# Patient Record
Sex: Female | Born: 2000
Health system: Southern US, Community
[De-identification: ages and names within clinical notes are randomized; demographics above are authoritative.]

## PROBLEM LIST (undated history)

## (undated) DIAGNOSIS — R51 Headache: Secondary | ICD-10-CM

## (undated) DIAGNOSIS — F329 Major depressive disorder, single episode, unspecified: Secondary | ICD-10-CM

## (undated) DIAGNOSIS — F419 Anxiety disorder, unspecified: Secondary | ICD-10-CM

## (undated) DIAGNOSIS — F913 Oppositional defiant disorder: Secondary | ICD-10-CM

## (undated) DIAGNOSIS — F32A Depression, unspecified: Secondary | ICD-10-CM

## (undated) DIAGNOSIS — R4689 Other symptoms and signs involving appearance and behavior: Secondary | ICD-10-CM

## (undated) DIAGNOSIS — IMO0002 Reserved for concepts with insufficient information to code with codable children: Secondary | ICD-10-CM

## (undated) DIAGNOSIS — D649 Anemia, unspecified: Secondary | ICD-10-CM

## (undated) DIAGNOSIS — R519 Headache, unspecified: Secondary | ICD-10-CM

## (undated) DIAGNOSIS — F909 Attention-deficit hyperactivity disorder, unspecified type: Secondary | ICD-10-CM

## (undated) DIAGNOSIS — F319 Bipolar disorder, unspecified: Secondary | ICD-10-CM

## (undated) DIAGNOSIS — T7840XA Allergy, unspecified, initial encounter: Secondary | ICD-10-CM

## (undated) DIAGNOSIS — G4700 Insomnia, unspecified: Secondary | ICD-10-CM

## (undated) HISTORY — DX: Headache, unspecified: R51.9

## (undated) HISTORY — DX: Bipolar disorder, unspecified: F31.9

## (undated) HISTORY — DX: Attention-deficit hyperactivity disorder, unspecified type: F90.9

## (undated) HISTORY — PX: HAND SURGERY: SHX662

## (undated) HISTORY — DX: Depression, unspecified: F32.A

## (undated) HISTORY — DX: Oppositional defiant disorder: F91.3

## (undated) HISTORY — DX: Other symptoms and signs involving appearance and behavior: R46.89

## (undated) HISTORY — DX: Allergy, unspecified, initial encounter: T78.40XA

## (undated) HISTORY — DX: Reserved for concepts with insufficient information to code with codable children: IMO0002

## (undated) HISTORY — DX: Major depressive disorder, single episode, unspecified: F32.9

## (undated) HISTORY — PX: DENTAL SURGERY: SHX609

## (undated) HISTORY — DX: Insomnia, unspecified: G47.00

## (undated) HISTORY — DX: Headache: R51

---
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
## 2018-01-29 ENCOUNTER — Other Ambulatory Visit: Payer: Self-pay

## 2018-01-29 ENCOUNTER — Emergency Department (HOSPITAL_COMMUNITY)
Admission: EM | Admit: 2018-01-29 | Discharge: 2018-01-29 | Disposition: A | Discharge status: Home / Self Care | Attending: Emergency Medicine | Admitting: Emergency Medicine

## 2018-01-29 ENCOUNTER — Encounter (HOSPITAL_COMMUNITY): Payer: Self-pay | Admitting: Emergency Medicine

## 2018-01-29 ENCOUNTER — Emergency Department (HOSPITAL_COMMUNITY)

## 2018-01-29 DIAGNOSIS — R110 Nausea: Secondary | ICD-10-CM | POA: Insufficient documentation

## 2018-01-29 DIAGNOSIS — G44209 Tension-type headache, unspecified, not intractable: Secondary | ICD-10-CM | POA: Insufficient documentation

## 2018-01-29 DIAGNOSIS — Y999 Unspecified external cause status: Secondary | ICD-10-CM | POA: Insufficient documentation

## 2018-01-29 DIAGNOSIS — Y9289 Other specified places as the place of occurrence of the external cause: Secondary | ICD-10-CM | POA: Diagnosis not present

## 2018-01-29 DIAGNOSIS — R42 Dizziness and giddiness: Secondary | ICD-10-CM | POA: Insufficient documentation

## 2018-01-29 DIAGNOSIS — Y9389 Activity, other specified: Secondary | ICD-10-CM | POA: Insufficient documentation

## 2018-01-29 DIAGNOSIS — F17210 Nicotine dependence, cigarettes, uncomplicated: Secondary | ICD-10-CM | POA: Diagnosis not present

## 2018-01-29 DIAGNOSIS — W01198A Fall on same level from slipping, tripping and stumbling with subsequent striking against other object, initial encounter: Secondary | ICD-10-CM | POA: Insufficient documentation

## 2018-01-29 DIAGNOSIS — S0990XA Unspecified injury of head, initial encounter: Secondary | ICD-10-CM | POA: Diagnosis present

## 2018-01-29 DIAGNOSIS — S060X0A Concussion without loss of consciousness, initial encounter: Secondary | ICD-10-CM | POA: Insufficient documentation

## 2018-01-29 MED ORDER — METHOCARBAMOL 500 MG PO TABS: 500.0000 mg | ORAL_TABLET | Freq: Three times a day (TID) | ORAL | 0 refills | Status: DC | PRN

## 2018-01-29 MED ORDER — ONDANSETRON 4 MG PO TBDP
4.0000 mg | ORAL_TABLET | Freq: Once | ORAL | Status: AC
  Administered 2018-01-29: 4 mg via ORAL
  Filled 2018-01-29: qty 1

## 2018-01-29 MED ORDER — ONDANSETRON 4 MG PO TBDP: 4.0000 mg | ORAL_TABLET | Freq: Three times a day (TID) | ORAL | 0 refills | Status: DC | PRN
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
## 2018-01-29 NOTE — Discharge Instructions (Signed)
Zofran for nausea. Robaxin for muscle tension headache

## 2018-01-29 NOTE — ED Notes (Addendum)
Pt updated on wait time. Verbalized understanding
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
## 2018-01-29 NOTE — ED Notes (Signed)
Pt alert & oriented x4, stable gait. Patient given discharge instructions, paperwork & prescription(s). Patient  instructed to stop at the registration desk to finish any additional paperwork. Patient verbalized understanding. Pt left department w/ no further questions. 

## 2018-08-03 ENCOUNTER — Ambulatory Visit: Payer: Self-pay

## 2018-08-03 NOTE — Telephone Encounter (Signed)
Appt. Scheduled for 9:00 AM, 08/04/18; attempted to call the pt's. Mother.  Left voice message re: the above appt. And requested to call back to confirm that message was rec'd/ appt. Is okay.

## 2018-08-03 NOTE — Telephone Encounter (Signed)
rec'd call from patient's mother to report symptoms of dizziness, vomiting, and very pale/ gray color.  Due to mother not on DPR, asked to speak to the patient.  The pt. was able to speak clearly; reported she woke up about 9:00 AM and began feeling dizzy and nauseated about 9:30 AM.  Stated her mother noticed her lips were "a purple color".  Pt. stated she vomited x 3 this morning.  Described that she feels "woozy, drowsy, and everything is slow."  Stated she started on Seroquel 200 mg., two nights ago.  Reported has had some intermittent blurred vision this morning.  Denied blurred vision at this time.  Stated she noticed her "mouth was very dry like cotton" during the night. Now, reported she has had 2 glasses of water, and feels better.  Stated "I think I'm just dehydrated."  Reported she is urinating in her normal amounts.  Pulse checked, by mother, during call; pulse 88 and regular.  Does not have BP monitor at home.  The pt. denied any shortness of breath.  Denied any pain.  Denied rash.  Advised she will need to be evaluated by PCP.  The pt. stated she has to go to work, and is not able to call out.    Stated she isn't able to go to an appt. until later today.  Advised that she should not drive with the current dizziness.  Stated someone will take her to work.  Spoke with pt's. mother.  Advised that pt. should be evaluated for her symptoms.  Mother stated she is having someone take her to work, and will stay with her, and if her symptoms worsen, she will take her to the hospital.  Advised mother that there are no avail. appts. at [facility] today.  Will look at poss. options for another office.  Stated the pt. Has to leave for work, and requested to call back with appt. Information.  Pt. Gave nurse permission to call her mother back to discuss appt. Information.                Reason for Disposition . [1] MODERATE dizziness (e.g., interferes with normal activities) AND [2] has NOT been evaluated by  physician for this  (Exception: dizziness caused by heat exposure, sudden standing, or poor fluid intake)  Answer Assessment - Initial Assessment Questions 1. DESCRIPTION: "Describe your dizziness."     Feels woozy, drowsy, everything is slow  2. LIGHTHEADED: "Do you feel lightheaded?" (e.g., somewhat faint, woozy, weak upon standing)     See above 3. VERTIGO: "Do you feel like either you or the room is spinning or tilting?" (i.e. vertigo)     Denied 4. SEVERITY: "How bad is it?"  "Do you feel like you are going to faint?" "Can you stand and walk?"   - MILD - walking normally   - MODERATE - interferes with normal activities (e.g., work, school)    - SEVERE - unable to stand, requires support to walk, feels like passing out now.      Was severe at onset; now improved  5. ONSET:  "When did the dizziness begin?"     This AM, approx. 9:30 AM 6. AGGRAVATING FACTORS: "Does anything make it worse?" (e.g., standing, change in head position)     Dizziness increases with bending   7. HEART RATE: "Can you tell me your heart rate?" "How many beats in 15 seconds?"  (Note: not all patients can do this)  88 and regular 8. CAUSE: "What do you think is causing the dizziness?"     Unsure ; started Seroquel 2 days ago; feels that she could be dehydrated  9. RECURRENT SYMPTOM: "Have you had dizziness before?" If so, ask: "When was the last time?" "What happened that time?"     No  10. OTHER SYMPTOMS: "Do you have any other symptoms?" (e.g., fever, chest pain, vomiting, diarrhea, bleeding)       Vomited 3 times; intermittent blurred vision, denied headache; denied pain, color pale, reported mouth very dry during sleep; denied rash ; denied fever/ chills ; drank 2 glasses of water this AM.    11. PREGNANCY: "Is there any chance you are pregnant?" "When was your last menstrual period?"       No ; on menstrual cycle now  Protocols used: DIZZINESS [facility]

## 2018-08-03 NOTE — Telephone Encounter (Signed)
Awaiting Saturday Clinic schedule to open up.

## 2018-08-03 NOTE — Telephone Encounter (Signed)
Called pt's mother back and offered an appt. at [facility] this afternoon.  Mother stated she is not sure if pt. Will be able to find someone to work for her.  Requested an appt. On Saturday.  Advised will call back and schedule appt. When the Sat. Clinic schedule is accessible, after 12:00 PM.  Agreed with plan.

## 2018-08-04 ENCOUNTER — Ambulatory Visit: Admitting: Family Medicine

## 2018-08-04 DIAGNOSIS — Z0289 Encounter for other administrative examinations: Secondary | ICD-10-CM
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
## 2019-08-14 ENCOUNTER — Ambulatory Visit (INDEPENDENT_AMBULATORY_CARE_PROVIDER_SITE_OTHER): Payer: Self-pay | Admitting: Family Medicine

## 2019-08-14 ENCOUNTER — Encounter: Payer: Self-pay | Admitting: Family Medicine

## 2019-08-14 ENCOUNTER — Other Ambulatory Visit: Payer: Self-pay

## 2019-08-14 ENCOUNTER — Other Ambulatory Visit (HOSPITAL_COMMUNITY)
Admission: RE | Admit: 2019-08-14 | Discharge: 2019-08-14 | Disposition: A | Discharge status: Home / Self Care | Payer: Self-pay | Source: Ambulatory Visit | Attending: Family Medicine | Admitting: Family Medicine

## 2019-08-14 VITALS — BP 111/80 | HR 103 | Temp 99.0°F | Resp 16 | Ht 63.0 in | Wt 170.0 lb

## 2019-08-14 DIAGNOSIS — R300 Dysuria: Secondary | ICD-10-CM | POA: Insufficient documentation

## 2019-08-14 DIAGNOSIS — N912 Amenorrhea, unspecified: Secondary | ICD-10-CM

## 2019-08-14 DIAGNOSIS — N898 Other specified noninflammatory disorders of vagina: Secondary | ICD-10-CM

## 2019-08-14 LAB — POC URINALSYSI DIPSTICK (AUTOMATED)
Bilirubin, UA: NEGATIVE
Glucose, UA: NEGATIVE
Ketones, UA: NEGATIVE
Nitrite, UA: NEGATIVE
Protein, UA: POSITIVE — AB
Spec Grav, UA: 1.025 (ref 1.010–1.025)
Urobilinogen, UA: 0.2 E.U./dL
pH, UA: 6 (ref 5.0–8.0)

## 2019-08-14 LAB — POCT URINE PREGNANCY: Preg Test, Ur: NEGATIVE

## 2019-08-14 MED ORDER — CEPHALEXIN 500 MG PO CAPS: 500.0000 mg | ORAL_CAPSULE | Freq: Three times a day (TID) | ORAL | 0 refills | Status: DC

## 2019-08-14 MED ORDER — PHENAZOPYRIDINE HCL 100 MG PO TABS: 100.0000 mg | ORAL_TABLET | Freq: Three times a day (TID) | ORAL | 0 refills | Status: DC | PRN
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
## 2019-08-14 NOTE — Patient Instructions (Signed)
Start cephalexin every 8 hours for 7 days- today.  Start pyridium every 8 hours for symptoms- this helps decrease the discomfort. It will make you pee orange.   We will call you when we get the urine culture back.   Drink at least 80 ounces of water a day.

## 2019-08-16 LAB — URINE CULTURE
MICRO NUMBER:: 989195
SPECIMEN QUALITY:: ADEQUATE

## 2019-08-16 LAB — EXTRA URINE SPECIMEN

## 2019-08-21 ENCOUNTER — Telehealth: Payer: Self-pay | Admitting: Family Medicine

## 2019-08-21 LAB — URINE CYTOLOGY ANCILLARY ONLY
Bacterial Vaginitis-Urine: POSITIVE — AB
Candida Urine: NEGATIVE
Chlamydia: NEGATIVE
Comment: NEGATIVE
Comment: NEGATIVE
Comment: NORMAL
Neisseria Gonorrhea: NEGATIVE
Trichomonas: NEGATIVE

## 2019-08-21 MED ORDER — METRONIDAZOLE 500 MG PO TABS: 500.0000 mg | ORAL_TABLET | Freq: Two times a day (BID) | ORAL | 0 refills | Status: DC

## 2019-08-21 NOTE — Telephone Encounter (Signed)
Please inform patient the following information: Pt did have bacterial vaginosis infection as well per her send out lab. I have called in an additional medication to cover bacterial vaginosis which is also called BV.  It is not a sexually transmitted disease.  She tested negative for yeast, gonorrhea, chlamydia and trichomonas.  Advise her to not consume any alcoholic beverages, if she does consume alcohol, while taking this medication as it can cause her to feel very ill.

## 2019-08-21 NOTE — Telephone Encounter (Signed)
Pt was called and given lab results/instructions. She verbalized understanding.  

## 2019-09-19 ENCOUNTER — Other Ambulatory Visit: Payer: Self-pay

## 2019-09-20 ENCOUNTER — Ambulatory Visit (INDEPENDENT_AMBULATORY_CARE_PROVIDER_SITE_OTHER): Admitting: Family Medicine

## 2019-09-20 ENCOUNTER — Other Ambulatory Visit (HOSPITAL_COMMUNITY)
Admission: RE | Admit: 2019-09-20 | Discharge: 2019-09-20 | Disposition: A | Discharge status: Home / Self Care | Source: Ambulatory Visit | Attending: Family Medicine | Admitting: Family Medicine

## 2019-09-20 ENCOUNTER — Encounter: Payer: Self-pay | Admitting: Family Medicine

## 2019-09-20 VITALS — BP 125/81 | HR 91 | Temp 98.7°F | Resp 16 | Ht 63.0 in | Wt 169.1 lb

## 2019-09-20 DIAGNOSIS — N898 Other specified noninflammatory disorders of vagina: Secondary | ICD-10-CM | POA: Diagnosis not present

## 2019-09-20 MED ORDER — FLUCONAZOLE 150 MG PO TABS: 150.0000 mg | ORAL_TABLET | Freq: Once | ORAL | 0 refills | Status: AC
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
## 2019-09-20 NOTE — Patient Instructions (Signed)
This appears to be a yeast infection.  I have called in the diflucan one time pill to treat yeast infection.  Avoid scented body washes. Use dial, dove or ivory.    We will call you with results once we get them which can take a few days.     Vaginal Yeast infection, Adult  Vaginal yeast infection is a condition that causes vaginal discharge as well as soreness, swelling, and redness (inflammation) of the vagina. This is a common condition. Some women get this infection frequently. What are the causes? This condition is caused by a change in the normal balance of the yeast (candida) and bacteria that live in the vagina. This change causes an overgrowth of yeast, which causes the inflammation. What increases the risk? The condition is more likely to develop in women who:  Take antibiotic medicines.  Have diabetes.  Take birth control pills.  Are pregnant.  Douche often.  Have a weak body defense system (immune system).  Have been taking steroid medicines for a long time.  Frequently wear tight clothing. What are the signs or symptoms? Symptoms of this condition include:  White, thick, creamy vaginal discharge.  Swelling, itching, redness, and irritation of the vagina. The lips of the vagina (vulva) may be affected as well.  Pain or a burning feeling while urinating.  Pain during sex. How is this diagnosed? This condition is diagnosed based on:  Your medical history.  A physical exam.  A pelvic exam. Your health care provider will examine a sample of your vaginal discharge under a microscope. Your health care provider may send this sample for testing to confirm the diagnosis. How is this treated? This condition is treated with medicine. Medicines may be over-the-counter or prescription. You may be told to use one or more of the following:  Medicine that is taken by mouth (orally).  Medicine that is applied as a cream (topically).  Medicine that is inserted  directly into the vagina (suppository). Follow these instructions at home:  Lifestyle  Do not have sex until your health care provider approves. Tell your sex partner that you have a yeast infection. That person should go to his or her health care provider and ask if they should also be treated.  Do not wear tight clothes, such as pantyhose or tight pants.  Wear breathable cotton underwear. General instructions  Take or apply over-the-counter and prescription medicines only as told by your health care provider.  Eat more yogurt. This may help to keep your yeast infection from returning.  Do not use tampons until your health care provider approves.  Try taking a sitz bath to help with discomfort. This is a warm water bath that is taken while you are sitting down. The water should only come up to your hips and should cover your buttocks. Do this 3-4 times per day or as told by your health care provider.  Do not douche.  If you have diabetes, keep your blood sugar levels under control.  Keep all follow-up visits as told by your health care provider. This is important. Contact a health care provider if:  You have a fever.  Your symptoms go away and then return.  Your symptoms do not get better with treatment.  Your symptoms get worse.  You have new symptoms.  You develop blisters in or around your vagina.  You have blood coming from your vagina and it is not your menstrual period.  You develop pain in your abdomen. Summary  Vaginal yeast infection is a condition that causes discharge as well as soreness, swelling, and redness (inflammation) of the vagina.  This condition is treated with medicine. Medicines may be over-the-counter or prescription.  Take or apply over-the-counter and prescription medicines only as told by your health care provider.  Do not douche. Do not have sex or use tampons until your health care provider approves.  Contact a health care provider if  your symptoms do not get better with treatment or your symptoms go away and then return. This information is not intended to replace advice given to you by your health care provider. Make sure you discuss any questions you have with your health care provider. Document Released: 07/27/2005 Document Revised: 03/05/2018 Document Reviewed: 03/05/2018 Elsevier Patient Education  2020 [employer].

## 2019-09-23 ENCOUNTER — Encounter: Payer: Self-pay | Admitting: Family Medicine

## 2019-09-24 LAB — CERVICOVAGINAL ANCILLARY ONLY
Bacterial Vaginitis (gardnerella): NEGATIVE
Candida Glabrata: NEGATIVE
Candida Vaginitis: POSITIVE — AB
Chlamydia: NEGATIVE
Comment: NEGATIVE
Comment: NEGATIVE
Comment: NEGATIVE
Comment: NEGATIVE
Comment: NEGATIVE
Comment: NORMAL
Neisseria Gonorrhea: NEGATIVE
Trichomonas: NEGATIVE

## 2019-10-16 ENCOUNTER — Telehealth: Payer: Self-pay | Admitting: Family Medicine

## 2019-10-16 NOTE — Telephone Encounter (Signed)
Mother called about getting a refill on a prescription for yeast infection  fluconazole (DIFLUCAN) 150 MG tablet   [place], [place]    Mom can be reached at [phone]

## 2019-10-17 NOTE — Telephone Encounter (Addendum)
Mom states patient is having symptoms again - phone cut out and call was lost before I could find out what symptoms.     Is this refill appropriate or would patient need to be re-evaluted?  Please advise.

## 2019-10-17 NOTE — Telephone Encounter (Signed)
If patient has recurrence of yeast infection symptoms she should first try an over-the-counter product such as Monistat 3 ovules for treatment and Monistat cream for the discomfort.  If she fails an over-the-counter treatment, then she should have an appointment for evaluation.  Over-the-counter Monistat 3 ovules will effectively treat yeast infections.  If she continues to have recurrences of yeast infection, I would encourage her to establish with a gynecologist for further evaluation.

## 2019-10-17 NOTE — Telephone Encounter (Signed)
Patients mother advised of provider recommendations, verbalized understanding.

## 2019-10-17 NOTE — Telephone Encounter (Signed)
Patient called back and states that her symptoms are stings when peeing but not consistently.  Patient is also having discharge- white, cottage cheese type discharge.  This has been going on for about a week. Please advise.

## 2020-04-07 ENCOUNTER — Telehealth (INDEPENDENT_AMBULATORY_CARE_PROVIDER_SITE_OTHER): Admitting: Family Medicine

## 2020-04-07 ENCOUNTER — Encounter: Payer: Self-pay | Admitting: Family Medicine

## 2020-04-07 VITALS — Ht 63.0 in

## 2020-04-07 DIAGNOSIS — R0989 Other specified symptoms and signs involving the circulatory and respiratory systems: Secondary | ICD-10-CM | POA: Diagnosis not present

## 2020-04-07 DIAGNOSIS — R059 Cough, unspecified: Secondary | ICD-10-CM

## 2020-04-07 DIAGNOSIS — R05 Cough: Secondary | ICD-10-CM

## 2020-04-07 DIAGNOSIS — J029 Acute pharyngitis, unspecified: Secondary | ICD-10-CM

## 2020-04-07 MED ORDER — DOXYCYCLINE HYCLATE 100 MG PO TABS: 100.0000 mg | ORAL_TABLET | Freq: Two times a day (BID) | ORAL | 0 refills | Status: DC

## 2020-04-07 MED ORDER — BENZONATATE 100 MG PO CAPS: 200.0000 mg | ORAL_CAPSULE | Freq: Two times a day (BID) | ORAL | 0 refills | Status: DC | PRN
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
## 2020-09-02 ENCOUNTER — Other Ambulatory Visit (HOSPITAL_COMMUNITY): Payer: Self-pay | Admitting: Psychiatry

## 2020-09-02 ENCOUNTER — Ambulatory Visit (INDEPENDENT_AMBULATORY_CARE_PROVIDER_SITE_OTHER): Payer: No Payment, Other | Admitting: Psychiatry

## 2020-09-02 ENCOUNTER — Other Ambulatory Visit: Payer: Self-pay

## 2020-09-02 ENCOUNTER — Encounter (HOSPITAL_COMMUNITY): Payer: Self-pay | Admitting: Psychiatry

## 2020-09-02 VITALS — BP 108/53 | HR 58 | Ht 62.0 in | Wt 168.0 lb

## 2020-09-02 DIAGNOSIS — F3111 Bipolar disorder, current episode manic without psychotic features, mild: Secondary | ICD-10-CM | POA: Diagnosis not present

## 2020-09-02 MED ORDER — HYDROXYZINE HCL 25 MG PO TABS: 25.0000 mg | ORAL_TABLET | Freq: Three times a day (TID) | ORAL | 0 refills | Status: DC | PRN

## 2020-09-02 MED ORDER — TRAZODONE HCL 50 MG PO TABS: 50.0000 mg | ORAL_TABLET | Freq: Every day | ORAL | 1 refills | Status: DC

## 2020-09-02 MED ORDER — ARIPIPRAZOLE 5 MG PO TABS: 5.0000 mg | ORAL_TABLET | Freq: Every day | ORAL | 1 refills | Status: DC

## 2020-09-02 MED FILL — ARIPiprazole 5 MG TABS: 5 | 30 days supply | Qty: 30 | Fill #0

## 2020-09-02 MED FILL — hydrOXYzine HCL 25 MG TABS: 25 | 10 days supply | Qty: 30 | Fill #0

## 2020-09-02 MED FILL — traZODone HCL 50 MG TABS: 50 | 30 days supply | Qty: 30 | Fill #0
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
## 2020-09-30 ENCOUNTER — Other Ambulatory Visit (HOSPITAL_COMMUNITY): Payer: Self-pay | Admitting: Psychiatry

## 2020-09-30 DIAGNOSIS — F3111 Bipolar disorder, current episode manic without psychotic features, mild: Secondary | ICD-10-CM

## 2020-09-30 MED FILL — hydrOXYzine HCL 25 MG TABS: 25 | 10 days supply | Qty: 30 | Fill #0

## 2020-09-30 MED FILL — ARIPiprazole 5 MG TABS: 5 | 30 days supply | Qty: 30 | Fill #1

## 2020-10-09 ENCOUNTER — Ambulatory Visit (HOSPITAL_COMMUNITY): Payer: Self-pay | Admitting: Psychiatry

## 2020-11-03 ENCOUNTER — Other Ambulatory Visit: Payer: Self-pay

## 2020-11-03 ENCOUNTER — Other Ambulatory Visit (HOSPITAL_COMMUNITY): Payer: Self-pay | Admitting: Psychiatry

## 2020-11-03 ENCOUNTER — Encounter (HOSPITAL_COMMUNITY): Payer: Self-pay | Admitting: Psychiatry

## 2020-11-03 ENCOUNTER — Telehealth (INDEPENDENT_AMBULATORY_CARE_PROVIDER_SITE_OTHER): Payer: No Payment, Other | Admitting: Psychiatry

## 2020-11-03 DIAGNOSIS — F3174 Bipolar disorder, in full remission, most recent episode manic: Secondary | ICD-10-CM | POA: Diagnosis not present

## 2020-11-03 DIAGNOSIS — F3111 Bipolar disorder, current episode manic without psychotic features, mild: Secondary | ICD-10-CM

## 2020-11-03 MED ORDER — ARIPIPRAZOLE 5 MG PO TABS: 5.0000 mg | ORAL_TABLET | Freq: Every day | ORAL | 1 refills | Status: DC

## 2020-11-03 MED ORDER — HYDROXYZINE HCL 25 MG PO TABS: 25.0000 mg | ORAL_TABLET | Freq: Three times a day (TID) | ORAL | 1 refills | Status: DC | PRN

## 2020-11-03 MED FILL — hydrOXYzine HCL 25 MG TABS: 25 | 10 days supply | Qty: 30 | Fill #0

## 2020-11-03 MED FILL — ARIPiprazole 5 MG TABS: 5 | 30 days supply | Qty: 30 | Fill #0
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
## 2020-11-30 MED FILL — hydrOXYzine HCL 25 MG TABS: 25 | 10 days supply | Qty: 30 | Fill #1

## 2020-11-30 MED FILL — ARIPiprazole 5 MG TABS: 5 | 30 days supply | Qty: 30 | Fill #1

## 2020-12-29 ENCOUNTER — Other Ambulatory Visit: Payer: Self-pay

## 2020-12-29 ENCOUNTER — Other Ambulatory Visit (HOSPITAL_COMMUNITY): Payer: Self-pay | Admitting: Psychiatry

## 2020-12-29 ENCOUNTER — Ambulatory Visit (INDEPENDENT_AMBULATORY_CARE_PROVIDER_SITE_OTHER): Payer: No Payment, Other | Admitting: Psychiatry

## 2020-12-29 ENCOUNTER — Encounter (HOSPITAL_COMMUNITY): Payer: Self-pay | Admitting: Psychiatry

## 2020-12-29 VITALS — BP 123/65 | HR 89 | Ht 62.0 in | Wt 166.0 lb

## 2020-12-29 DIAGNOSIS — F419 Anxiety disorder, unspecified: Secondary | ICD-10-CM | POA: Diagnosis not present

## 2020-12-29 DIAGNOSIS — F3174 Bipolar disorder, in full remission, most recent episode manic: Secondary | ICD-10-CM | POA: Insufficient documentation

## 2020-12-29 MED ORDER — ARIPIPRAZOLE 5 MG PO TABS: 5.0000 mg | ORAL_TABLET | Freq: Every day | ORAL | 1 refills | Status: DC

## 2020-12-29 MED ORDER — HYDROXYZINE HCL 25 MG PO TABS: 25.0000 mg | ORAL_TABLET | Freq: Three times a day (TID) | ORAL | 1 refills | Status: DC | PRN

## 2020-12-29 MED ORDER — SERTRALINE HCL 50 MG PO TABS: 50.0000 mg | ORAL_TABLET | Freq: Every day | ORAL | 1 refills | Status: DC
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
## 2020-12-30 MED FILL — ARIPiprazole 5 MG TABS: 5 | 30 days supply | Qty: 30 | Fill #0

## 2020-12-30 MED FILL — hydrOXYzine HCL 25 MG TABS: 25 | 30 days supply | Qty: 90 | Fill #0

## 2020-12-30 MED FILL — SERTRALINE HCL 50 MG TABLET: 50 | 30 days supply | Qty: 30 | Fill #0

## 2021-02-05 ENCOUNTER — Other Ambulatory Visit: Payer: Self-pay

## 2021-02-05 MED FILL — Aripiprazole Tab 5 MG: ORAL | 30 days supply | Qty: 30 | Fill #0 | Status: AC

## 2021-02-08 ENCOUNTER — Other Ambulatory Visit: Payer: Self-pay

## 2021-03-01 ENCOUNTER — Ambulatory Visit (HOSPITAL_COMMUNITY): Payer: No Payment, Other | Admitting: Psychiatry

## 2021-03-09 ENCOUNTER — Other Ambulatory Visit: Payer: Self-pay

## 2021-03-09 ENCOUNTER — Telehealth (INDEPENDENT_AMBULATORY_CARE_PROVIDER_SITE_OTHER): Payer: No Payment, Other | Admitting: Psychiatry

## 2021-03-09 ENCOUNTER — Encounter (HOSPITAL_COMMUNITY): Payer: Self-pay | Admitting: Psychiatry

## 2021-03-09 DIAGNOSIS — F419 Anxiety disorder, unspecified: Secondary | ICD-10-CM

## 2021-03-09 DIAGNOSIS — F3161 Bipolar disorder, current episode mixed, mild: Secondary | ICD-10-CM

## 2021-03-09 MED ORDER — SERTRALINE HCL 50 MG PO TABS
ORAL_TABLET | Freq: Every day | ORAL | 0 refills | Status: DC
  Filled 2021-03-09: qty 30, 30d supply, fill #0

## 2021-03-09 MED ORDER — ARIPIPRAZOLE 10 MG PO TABS
10.0000 mg | ORAL_TABLET | Freq: Every day | ORAL | 0 refills | Status: DC
  Filled 2021-03-09: qty 30, 30d supply, fill #0

## 2021-03-09 MED ORDER — HYDROXYZINE HCL 25 MG PO TABS
ORAL_TABLET | Freq: Three times a day (TID) | ORAL | 0 refills | Status: DC | PRN
  Filled 2021-03-09: qty 90, 30d supply, fill #0
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
## 2021-03-10 ENCOUNTER — Other Ambulatory Visit: Payer: Self-pay

## 2021-04-08 ENCOUNTER — Other Ambulatory Visit: Payer: Self-pay

## 2021-04-08 ENCOUNTER — Ambulatory Visit (INDEPENDENT_AMBULATORY_CARE_PROVIDER_SITE_OTHER): Payer: No Payment, Other | Admitting: Psychiatry

## 2021-04-08 ENCOUNTER — Encounter (HOSPITAL_COMMUNITY): Payer: Self-pay | Admitting: Psychiatry

## 2021-04-08 DIAGNOSIS — F419 Anxiety disorder, unspecified: Secondary | ICD-10-CM

## 2021-04-08 DIAGNOSIS — F319 Bipolar disorder, unspecified: Secondary | ICD-10-CM | POA: Diagnosis not present

## 2021-04-08 MED ORDER — CARIPRAZINE HCL 3 MG PO CAPS
3.0000 mg | ORAL_CAPSULE | Freq: Every day | ORAL | 1 refills | Status: DC
  Filled 2021-04-08: qty 30, 30d supply, fill #0

## 2021-04-08 MED ORDER — SERTRALINE HCL 50 MG PO TABS
ORAL_TABLET | Freq: Every day | ORAL | 1 refills | Status: DC
  Filled 2021-04-08: qty 30, 30d supply, fill #0

## 2021-04-08 MED ORDER — HYDROXYZINE HCL 25 MG PO TABS
ORAL_TABLET | Freq: Three times a day (TID) | ORAL | 1 refills | Status: AC | PRN
  Filled 2021-04-08: qty 90, 30d supply, fill #0
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
## 2021-04-15 ENCOUNTER — Other Ambulatory Visit: Payer: Self-pay

## 2021-05-21 ENCOUNTER — Other Ambulatory Visit: Payer: Self-pay

## 2021-05-21 ENCOUNTER — Ambulatory Visit (INDEPENDENT_AMBULATORY_CARE_PROVIDER_SITE_OTHER): Payer: No Payment, Other | Admitting: Physician Assistant

## 2021-05-21 ENCOUNTER — Encounter (HOSPITAL_COMMUNITY): Payer: Self-pay | Admitting: Physician Assistant

## 2021-05-21 VITALS — BP 110/60 | HR 82 | Ht 63.0 in | Wt 173.0 lb

## 2021-05-21 DIAGNOSIS — F319 Bipolar disorder, unspecified: Secondary | ICD-10-CM

## 2021-05-21 DIAGNOSIS — F419 Anxiety disorder, unspecified: Secondary | ICD-10-CM

## 2021-05-21 MED ORDER — CARIPRAZINE HCL 3 MG PO CAPS
3.0000 mg | ORAL_CAPSULE | Freq: Every day | ORAL | 1 refills | Status: DC
  Filled 2021-05-21 (×2): qty 30, 30d supply, fill #0
  Filled 2021-06-29: qty 30, 30d supply, fill #1
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
## 2021-05-24 ENCOUNTER — Other Ambulatory Visit: Payer: Self-pay

## 2021-06-16 ENCOUNTER — Encounter: Payer: Self-pay | Admitting: Family Medicine

## 2021-06-16 ENCOUNTER — Other Ambulatory Visit: Payer: Self-pay

## 2021-06-16 ENCOUNTER — Ambulatory Visit: Payer: BC Managed Care – PPO | Admitting: Family Medicine

## 2021-06-16 VITALS — BP 135/75 | HR 91 | Temp 98.0°F | Ht 63.0 in | Wt 177.8 lb

## 2021-06-16 DIAGNOSIS — K0889 Other specified disorders of teeth and supporting structures: Secondary | ICD-10-CM | POA: Diagnosis not present

## 2021-06-16 DIAGNOSIS — I889 Nonspecific lymphadenitis, unspecified: Secondary | ICD-10-CM | POA: Diagnosis not present

## 2021-06-16 MED ORDER — AMOXICILLIN-POT CLAVULANATE 875-125 MG PO TABS: 1.0000 | ORAL_TABLET | Freq: Two times a day (BID) | ORAL | 0 refills | Status: DC
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
## 2021-06-29 ENCOUNTER — Other Ambulatory Visit: Payer: Self-pay

## 2021-06-29 DIAGNOSIS — F3189 Other bipolar disorder: Secondary | ICD-10-CM | POA: Diagnosis not present

## 2021-06-29 DIAGNOSIS — F411 Generalized anxiety disorder: Secondary | ICD-10-CM | POA: Diagnosis not present

## 2021-06-30 ENCOUNTER — Other Ambulatory Visit: Payer: Self-pay

## 2021-07-08 DIAGNOSIS — F3189 Other bipolar disorder: Secondary | ICD-10-CM | POA: Diagnosis not present

## 2021-07-08 DIAGNOSIS — F411 Generalized anxiety disorder: Secondary | ICD-10-CM | POA: Diagnosis not present

## 2021-07-12 DIAGNOSIS — F3189 Other bipolar disorder: Secondary | ICD-10-CM | POA: Diagnosis not present

## 2021-07-12 DIAGNOSIS — F902 Attention-deficit hyperactivity disorder, combined type: Secondary | ICD-10-CM | POA: Diagnosis not present

## 2021-07-12 DIAGNOSIS — F411 Generalized anxiety disorder: Secondary | ICD-10-CM | POA: Diagnosis not present

## 2021-07-16 ENCOUNTER — Encounter (HOSPITAL_COMMUNITY): Payer: BC Managed Care – PPO | Admitting: Physician Assistant

## 2021-07-19 DIAGNOSIS — L239 Allergic contact dermatitis, unspecified cause: Secondary | ICD-10-CM | POA: Diagnosis not present

## 2021-07-22 DIAGNOSIS — F4321 Adjustment disorder with depressed mood: Secondary | ICD-10-CM | POA: Diagnosis not present

## 2021-07-22 DIAGNOSIS — F3189 Other bipolar disorder: Secondary | ICD-10-CM | POA: Diagnosis not present

## 2021-07-22 DIAGNOSIS — F411 Generalized anxiety disorder: Secondary | ICD-10-CM | POA: Diagnosis not present

## 2021-08-05 DIAGNOSIS — F411 Generalized anxiety disorder: Secondary | ICD-10-CM | POA: Diagnosis not present

## 2021-08-05 DIAGNOSIS — F3189 Other bipolar disorder: Secondary | ICD-10-CM | POA: Diagnosis not present

## 2021-09-06 DIAGNOSIS — F411 Generalized anxiety disorder: Secondary | ICD-10-CM | POA: Diagnosis not present

## 2021-09-06 DIAGNOSIS — F3189 Other bipolar disorder: Secondary | ICD-10-CM | POA: Diagnosis not present

## 2021-09-22 ENCOUNTER — Encounter: Payer: Self-pay | Admitting: Family Medicine

## 2021-09-22 ENCOUNTER — Other Ambulatory Visit: Payer: Self-pay

## 2021-09-22 ENCOUNTER — Ambulatory Visit: Payer: BC Managed Care – PPO | Admitting: Family Medicine

## 2021-09-22 VITALS — BP 128/86 | HR 87 | Temp 98.0°F | Ht 63.0 in | Wt 174.0 lb

## 2021-09-22 DIAGNOSIS — R102 Pelvic and perineal pain: Secondary | ICD-10-CM | POA: Diagnosis not present

## 2021-09-22 DIAGNOSIS — N760 Acute vaginitis: Secondary | ICD-10-CM

## 2021-09-22 MED ORDER — FLUCONAZOLE 150 MG PO TABS: ORAL_TABLET | ORAL | 0 refills | Status: DC

## 2021-09-22 MED ORDER — AMOXICILLIN 875 MG PO TABS: 875.0000 mg | ORAL_TABLET | Freq: Two times a day (BID) | ORAL | 0 refills | Status: AC
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
## 2021-11-04 DIAGNOSIS — F411 Generalized anxiety disorder: Secondary | ICD-10-CM | POA: Diagnosis not present

## 2021-11-04 DIAGNOSIS — F3189 Other bipolar disorder: Secondary | ICD-10-CM | POA: Diagnosis not present

## 2021-11-24 ENCOUNTER — Encounter (HOSPITAL_COMMUNITY): Payer: Self-pay | Admitting: Physician Assistant

## 2021-11-24 ENCOUNTER — Telehealth (INDEPENDENT_AMBULATORY_CARE_PROVIDER_SITE_OTHER): Payer: BC Managed Care – PPO | Admitting: Family

## 2021-11-24 ENCOUNTER — Other Ambulatory Visit: Payer: Self-pay

## 2021-11-24 DIAGNOSIS — F319 Bipolar disorder, unspecified: Secondary | ICD-10-CM

## 2021-11-24 MED ORDER — LAMOTRIGINE 100 MG PO TABS
100.0000 mg | ORAL_TABLET | Freq: Every day | ORAL | 0 refills | Status: DC
  Filled 2021-11-24 – 2021-11-29 (×2): qty 30, 30d supply, fill #0
  Filled 2022-01-12: qty 30, 30d supply, fill #1

## 2021-11-24 MED ORDER — CARIPRAZINE HCL 3 MG PO CAPS
3.0000 mg | ORAL_CAPSULE | Freq: Every day | ORAL | 1 refills | Status: DC
  Filled 2021-11-24 – 2021-11-29 (×2): qty 30, 30d supply, fill #0
  Filled 2022-02-07: qty 30, 30d supply, fill #1

## 2021-11-24 MED ORDER — CLONIDINE HCL 0.1 MG PO TABS
0.1000 mg | ORAL_TABLET | Freq: Two times a day (BID) | ORAL | 0 refills | Status: DC
  Filled 2021-11-24: qty 60, 30d supply, fill #0
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
## 2021-11-25 ENCOUNTER — Other Ambulatory Visit: Payer: Self-pay

## 2021-11-29 ENCOUNTER — Other Ambulatory Visit: Payer: Self-pay

## 2021-11-30 ENCOUNTER — Other Ambulatory Visit: Payer: Self-pay

## 2022-01-12 ENCOUNTER — Other Ambulatory Visit: Payer: Self-pay

## 2022-01-14 ENCOUNTER — Other Ambulatory Visit: Payer: Self-pay

## 2022-02-07 ENCOUNTER — Other Ambulatory Visit (HOSPITAL_COMMUNITY): Payer: Self-pay | Admitting: Family

## 2022-02-07 ENCOUNTER — Other Ambulatory Visit: Payer: Self-pay

## 2022-02-08 ENCOUNTER — Other Ambulatory Visit: Payer: Self-pay

## 2022-02-09 ENCOUNTER — Other Ambulatory Visit: Payer: Self-pay

## 2022-02-10 ENCOUNTER — Other Ambulatory Visit: Payer: Self-pay

## 2022-02-14 ENCOUNTER — Other Ambulatory Visit: Payer: Self-pay

## 2022-02-16 ENCOUNTER — Other Ambulatory Visit: Payer: Self-pay

## 2022-02-16 ENCOUNTER — Telehealth (INDEPENDENT_AMBULATORY_CARE_PROVIDER_SITE_OTHER): Payer: No Payment, Other | Admitting: Physician Assistant

## 2022-02-16 ENCOUNTER — Encounter (HOSPITAL_COMMUNITY): Payer: Self-pay | Admitting: Physician Assistant

## 2022-02-16 DIAGNOSIS — F3161 Bipolar disorder, current episode mixed, mild: Secondary | ICD-10-CM

## 2022-02-16 DIAGNOSIS — F419 Anxiety disorder, unspecified: Secondary | ICD-10-CM

## 2022-02-16 DIAGNOSIS — F319 Bipolar disorder, unspecified: Secondary | ICD-10-CM | POA: Diagnosis not present

## 2022-02-16 MED ORDER — CLONIDINE HCL 0.1 MG PO TABS
0.1000 mg | ORAL_TABLET | Freq: Two times a day (BID) | ORAL | 1 refills | Status: DC
  Filled 2022-02-16: qty 60, 30d supply, fill #0

## 2022-02-16 MED ORDER — LAMOTRIGINE 100 MG PO TABS
100.0000 mg | ORAL_TABLET | Freq: Every day | ORAL | 0 refills | Status: DC
  Filled 2022-02-16: qty 30, 30d supply, fill #0
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
## 2022-02-17 ENCOUNTER — Other Ambulatory Visit: Payer: Self-pay

## 2022-02-24 ENCOUNTER — Other Ambulatory Visit: Payer: Self-pay

## 2022-04-02 ENCOUNTER — Emergency Department (HOSPITAL_COMMUNITY)
Admission: EM | Admit: 2022-04-02 | Discharge: 2022-04-02 | Disposition: A | Discharge status: Home / Self Care | Payer: Self-pay | Attending: Emergency Medicine | Admitting: Emergency Medicine

## 2022-04-02 ENCOUNTER — Encounter (HOSPITAL_COMMUNITY): Payer: Self-pay

## 2022-04-02 DIAGNOSIS — T192XXA Foreign body in vulva and vagina, initial encounter: Secondary | ICD-10-CM | POA: Insufficient documentation

## 2022-04-02 DIAGNOSIS — X58XXXA Exposure to other specified factors, initial encounter: Secondary | ICD-10-CM | POA: Insufficient documentation

## 2022-04-02 DIAGNOSIS — R102 Pelvic and perineal pain: Secondary | ICD-10-CM | POA: Insufficient documentation

## 2022-04-02 LAB — WET PREP, GENITAL
Clue Cells Wet Prep HPF POC: NONE SEEN
Sperm: NONE SEEN
Trich, Wet Prep: NONE SEEN
WBC, Wet Prep HPF POC: >=10 — AB (ref <10)
Yeast Wet Prep HPF POC: NONE SEEN

## 2022-04-02 LAB — POC URINE PREG, ED: Preg Test, Ur: NEGATIVE

## 2022-04-02 NOTE — ED Provider Triage Note (Signed)
Emergency Medicine Provider Triage Evaluation Note  Candace Hoffman , a 21 y.o. female  was evaluated in triage.  Pt complains of concern for foreign body.  States that on 5/14 she completed her menstrual cycle.  She states since then she has had some intermittent spotting which is unusual but no other specific symptoms.  States that earlier today she felt a sharp pain in her abdomen and subsequently had a tampon expelled from her vagina.  She has not used a tampon since she completed her menstrual cycle.  She is concerned because she did not see a string on the tampon that came out and she is concerned that it may still be in her vagina.  She denies any fevers or chills.  She does state that she has had sexual intercourse several times since she completed her menstrual cycle.  Review of Systems  Positive:  Negative: See above  Physical Exam  BP 138/76 (BP Location: Left Arm)   Pulse (!) 102   Temp 98.9 F (37.2 C) (Oral)   Resp 16   Ht 5\' 3"  (1.6 m)   Wt 77.1 kg   LMP 03/13/2022   SpO2 100%   BMI 30.11 kg/m  Gen:   Awake, no distress   Resp:  Normal effort  MSK:   Moves extremities without difficulty  Other:    Medical Decision Making  Medically screening exam initiated at 5:37 PM.  Appropriate orders placed.  Candace Hoffman was informed that the remainder of the evaluation will be completed by another provider, this initial triage assessment does not replace that evaluation, and the importance of remaining in the ED until their evaluation is complete.     [name], PA-C 04/02/22 1740

## 2022-04-02 NOTE — ED Triage Notes (Signed)
Pt presents with c/o possible retained tampon products still in her vagina. Pt reports she had no recollection of still having a tampon in her vagina. Pt reports her period ended around 5/14 and today she felt an abdominal cramp and the tampon expelled itself. No string attached to the tampon when it came out.

## 2022-04-04 LAB — GC/CHLAMYDIA PROBE AMP (~~LOC~~) NOT AT ARMC
Chlamydia: NEGATIVE
Comment: NEGATIVE
Comment: NORMAL
Neisseria Gonorrhea: NEGATIVE
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
## 2022-04-19 ENCOUNTER — Encounter (HOSPITAL_COMMUNITY): Payer: Self-pay | Admitting: Physician Assistant

## 2022-04-19 ENCOUNTER — Telehealth (INDEPENDENT_AMBULATORY_CARE_PROVIDER_SITE_OTHER): Payer: No Payment, Other | Admitting: Physician Assistant

## 2022-04-19 DIAGNOSIS — F319 Bipolar disorder, unspecified: Secondary | ICD-10-CM

## 2022-04-19 DIAGNOSIS — F419 Anxiety disorder, unspecified: Secondary | ICD-10-CM | POA: Diagnosis not present
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
## 2022-05-05 ENCOUNTER — Other Ambulatory Visit: Payer: Self-pay

## 2022-06-21 ENCOUNTER — Ambulatory Visit
Admission: EM | Admit: 2022-06-21 | Discharge: 2022-06-21 | Disposition: A | Discharge status: Home / Self Care | Payer: Self-pay | Attending: Family Medicine | Admitting: Family Medicine

## 2022-06-21 ENCOUNTER — Encounter: Payer: Self-pay | Admitting: Emergency Medicine

## 2022-06-21 DIAGNOSIS — F913 Oppositional defiant disorder: Secondary | ICD-10-CM | POA: Insufficient documentation

## 2022-06-21 DIAGNOSIS — R5383 Other fatigue: Secondary | ICD-10-CM | POA: Insufficient documentation

## 2022-06-21 DIAGNOSIS — R109 Unspecified abdominal pain: Secondary | ICD-10-CM | POA: Insufficient documentation

## 2022-06-21 DIAGNOSIS — J039 Acute tonsillitis, unspecified: Secondary | ICD-10-CM | POA: Insufficient documentation

## 2022-06-21 DIAGNOSIS — R197 Diarrhea, unspecified: Secondary | ICD-10-CM | POA: Insufficient documentation

## 2022-06-21 DIAGNOSIS — F319 Bipolar disorder, unspecified: Secondary | ICD-10-CM | POA: Insufficient documentation

## 2022-06-21 DIAGNOSIS — R112 Nausea with vomiting, unspecified: Secondary | ICD-10-CM | POA: Insufficient documentation

## 2022-06-21 DIAGNOSIS — Z20822 Contact with and (suspected) exposure to covid-19: Secondary | ICD-10-CM | POA: Insufficient documentation

## 2022-06-21 DIAGNOSIS — F909 Attention-deficit hyperactivity disorder, unspecified type: Secondary | ICD-10-CM | POA: Insufficient documentation

## 2022-06-21 LAB — POCT URINALYSIS DIP (MANUAL ENTRY)
Bilirubin, UA: NEGATIVE
Blood, UA: NEGATIVE
Glucose, UA: NEGATIVE mg/dL
Ketones, POC UA: NEGATIVE mg/dL
Leukocytes, UA: NEGATIVE
Nitrite, UA: NEGATIVE
Protein Ur, POC: NEGATIVE mg/dL
Spec Grav, UA: 1.02 (ref 1.010–1.025)
Urobilinogen, UA: 0.2 E.U./dL
pH, UA: 7 (ref 5.0–8.0)

## 2022-06-21 LAB — RESP PANEL BY RT-PCR (FLU A&B, COVID) ARPGX2
Influenza A by PCR: NEGATIVE
Influenza B by PCR: NEGATIVE
SARS Coronavirus 2 by RT PCR: NEGATIVE

## 2022-06-21 MED ORDER — AMOXICILLIN-POT CLAVULANATE 875-125 MG PO TABS: 1.0000 | ORAL_TABLET | Freq: Two times a day (BID) | ORAL | 0 refills | Status: AC

## 2022-06-21 NOTE — Discharge Instructions (Addendum)
Advised patient to take medication as directed with food to completion.  Encouraged patient to increase daily water intake while taking this medication.  Advised patient if symptoms worsen and/or unresolved please follow-up with PCP or here for further evaluation. 

## 2022-06-21 NOTE — ED Triage Notes (Signed)
Patient c/o nausea, vomiting and diarrhea x 3 days.  Increased fatigue, stomach cramping, denies any urgency and frequency.  No change in diet.  Patient denies any OTC pain meds.
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
## 2022-06-22 ENCOUNTER — Telehealth: Payer: Self-pay

## 2022-06-22 NOTE — Telephone Encounter (Signed)
Called pt to check in status since UC visit. States shes feeling somewhat better. Advised to continue to take med as directed and follow up if any questions or concerns.

## 2022-07-19 ENCOUNTER — Encounter (HOSPITAL_COMMUNITY): Payer: Self-pay | Admitting: Physician Assistant

## 2022-07-19 ENCOUNTER — Telehealth (INDEPENDENT_AMBULATORY_CARE_PROVIDER_SITE_OTHER): Payer: No Payment, Other | Admitting: Physician Assistant

## 2022-07-19 DIAGNOSIS — F419 Anxiety disorder, unspecified: Secondary | ICD-10-CM

## 2022-07-19 DIAGNOSIS — F319 Bipolar disorder, unspecified: Secondary | ICD-10-CM | POA: Diagnosis not present

## 2022-07-19 DIAGNOSIS — G479 Sleep disorder, unspecified: Secondary | ICD-10-CM

## 2022-07-19 MED ORDER — TRAZODONE HCL 50 MG PO TABS: 50.0000 mg | ORAL_TABLET | Freq: Every day | ORAL | 2 refills | Status: DC
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
## 2022-07-25 ENCOUNTER — Other Ambulatory Visit: Payer: Self-pay

## 2022-07-26 ENCOUNTER — Other Ambulatory Visit (HOSPITAL_COMMUNITY): Payer: Self-pay | Admitting: Physician Assistant

## 2022-07-26 ENCOUNTER — Other Ambulatory Visit: Payer: Self-pay

## 2022-07-26 DIAGNOSIS — F319 Bipolar disorder, unspecified: Secondary | ICD-10-CM

## 2022-07-26 MED ORDER — CARIPRAZINE HCL 4.5 MG PO CAPS
4.5000 mg | ORAL_CAPSULE | Freq: Every day | ORAL | 2 refills | Status: AC
  Filled 2022-07-26: qty 30, 30d supply, fill #0

## 2022-07-26 NOTE — Progress Notes (Signed)
Patient's cariprazine medication to be adjusted from 3 mg to 4.5 mg daily for the management of her bipolar disorder

## 2022-07-27 ENCOUNTER — Other Ambulatory Visit: Payer: Self-pay

## 2022-08-02 ENCOUNTER — Ambulatory Visit
Admission: EM | Admit: 2022-08-02 | Discharge: 2022-08-02 | Disposition: A | Discharge status: Home / Self Care | Payer: Self-pay | Attending: Emergency Medicine | Admitting: Emergency Medicine

## 2022-08-02 ENCOUNTER — Other Ambulatory Visit: Payer: Self-pay

## 2022-08-02 ENCOUNTER — Ambulatory Visit (INDEPENDENT_AMBULATORY_CARE_PROVIDER_SITE_OTHER): Payer: Self-pay

## 2022-08-02 DIAGNOSIS — R112 Nausea with vomiting, unspecified: Secondary | ICD-10-CM

## 2022-08-02 DIAGNOSIS — R042 Hemoptysis: Secondary | ICD-10-CM

## 2022-08-02 DIAGNOSIS — B9689 Other specified bacterial agents as the cause of diseases classified elsewhere: Secondary | ICD-10-CM

## 2022-08-02 DIAGNOSIS — Z789 Other specified health status: Secondary | ICD-10-CM

## 2022-08-02 DIAGNOSIS — J038 Acute tonsillitis due to other specified organisms: Secondary | ICD-10-CM

## 2022-08-02 DIAGNOSIS — J028 Acute pharyngitis due to other specified organisms: Secondary | ICD-10-CM

## 2022-08-02 DIAGNOSIS — F1290 Cannabis use, unspecified, uncomplicated: Secondary | ICD-10-CM

## 2022-08-02 LAB — POCT URINE PREGNANCY: Preg Test, Ur: NEGATIVE

## 2022-08-02 MED ORDER — CEFDINIR 300 MG PO CAPS
300.0000 mg | ORAL_CAPSULE | Freq: Two times a day (BID) | ORAL | 0 refills | Status: DC
Start: 2022-08-02 — End: 2022-08-02
  Filled 2022-08-02: qty 20, 10d supply, fill #0

## 2022-08-02 MED ORDER — CEFDINIR 300 MG PO CAPS: 300.0000 mg | ORAL_CAPSULE | Freq: Two times a day (BID) | ORAL | 0 refills | Status: AC

## 2022-08-02 NOTE — ED Triage Notes (Signed)
Pt complains of sore throat for the past few weeks, nausea and vomiting, coughing up blood, throat feels tight which is causing difficulty to breath. Pt taken otc ibuprofen.  Denies fever

## 2022-08-02 NOTE — Discharge Instructions (Addendum)
Your chest x-ray today looks completely normal, no concern for lung mass or acute bronchitis.  Because you were only provided with 7 days of antibiotics last month, I believe that you are undertreated and require a longer course of antibiotics.  I sent a prescription for cefdinir to your pharmacy, please pick it up and begin taking 1 capsule twice daily.  I believe that these capsules are smaller than the Augmentin tablets.  Please let us know if you have not had complete relief of your symptoms after 10 days of cefdinir.  Your nausea and vomiting is most likely related to current use of marijuana.  I have enclosed information about cannabinoid hyperemesis syndrome so you can learn more about this problem.  Thank you for visiting urgent care today.
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
## 2022-08-05 ENCOUNTER — Other Ambulatory Visit: Payer: Self-pay

## 2022-08-09 ENCOUNTER — Other Ambulatory Visit: Payer: Self-pay

## 2022-10-21 ENCOUNTER — Other Ambulatory Visit: Payer: Self-pay

## 2022-11-11 ENCOUNTER — Ambulatory Visit (INDEPENDENT_AMBULATORY_CARE_PROVIDER_SITE_OTHER): Payer: No Payment, Other | Admitting: Student in an Organized Health Care Education/Training Program

## 2022-11-11 ENCOUNTER — Encounter (HOSPITAL_COMMUNITY): Payer: Self-pay | Admitting: Student in an Organized Health Care Education/Training Program

## 2022-11-11 VITALS — BP 129/77 | HR 88 | Wt 181.0 lb

## 2022-11-11 DIAGNOSIS — F319 Bipolar disorder, unspecified: Secondary | ICD-10-CM | POA: Diagnosis not present

## 2022-11-11 DIAGNOSIS — G479 Sleep disorder, unspecified: Secondary | ICD-10-CM | POA: Diagnosis not present

## 2022-11-11 DIAGNOSIS — F411 Generalized anxiety disorder: Secondary | ICD-10-CM

## 2022-11-11 DIAGNOSIS — F1210 Cannabis abuse, uncomplicated: Secondary | ICD-10-CM | POA: Insufficient documentation

## 2022-11-11 DIAGNOSIS — F17200 Nicotine dependence, unspecified, uncomplicated: Secondary | ICD-10-CM | POA: Diagnosis not present

## 2022-11-11 MED ORDER — PROPRANOLOL HCL 10 MG PO TABS: 10.0000 mg | ORAL_TABLET | Freq: Two times a day (BID) | ORAL | 3 refills | Status: AC

## 2022-11-11 MED ORDER — TRAZODONE HCL 50 MG PO TABS: 50.0000 mg | ORAL_TABLET | Freq: Every day | ORAL | 2 refills | Status: AC

## 2022-11-11 MED ORDER — NICOTINE POLACRILEX 4 MG MT GUM: 4.0000 mg | CHEWING_GUM | OROMUCOSAL | 0 refills | Status: AC | PRN
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
## 2022-12-02 ENCOUNTER — Ambulatory Visit (INDEPENDENT_AMBULATORY_CARE_PROVIDER_SITE_OTHER): Payer: No Payment, Other | Admitting: Clinical

## 2022-12-02 ENCOUNTER — Encounter (HOSPITAL_COMMUNITY): Payer: Self-pay

## 2022-12-02 DIAGNOSIS — F319 Bipolar disorder, unspecified: Secondary | ICD-10-CM | POA: Diagnosis not present

## 2022-12-02 DIAGNOSIS — F411 Generalized anxiety disorder: Secondary | ICD-10-CM
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
## 2022-12-30 ENCOUNTER — Ambulatory Visit (HOSPITAL_COMMUNITY): Payer: No Payment, Other | Admitting: Clinical

## 2022-12-30 ENCOUNTER — Encounter (HOSPITAL_COMMUNITY): Payer: Self-pay

## 2023-01-13 ENCOUNTER — Ambulatory Visit (INDEPENDENT_AMBULATORY_CARE_PROVIDER_SITE_OTHER): Payer: No Payment, Other | Admitting: Clinical

## 2023-01-13 DIAGNOSIS — F411 Generalized anxiety disorder: Secondary | ICD-10-CM

## 2023-01-13 NOTE — Progress Notes (Unsigned)
THERAPIST PROGRESS NOTE Virtual Visit via Video Note  I connected with Candace Hoffman on 01/13/23 at  8:00 AM EDT by a video enabled telemedicine application and verified that I am speaking with the correct person using two identifiers.  Location: Patient: home Provider: office   I discussed the limitations of evaluation and management by telemedicine and the availability of in person appointments. The patient expressed understanding and agreed to proceed.   Follow Up Instructions: I discussed the assessment and treatment plan with the patient. The patient was provided an opportunity to ask questions and all were answered. The patient agreed with the plan and demonstrated an understanding of the instructions.   The patient was advised to call back or seek an in-person evaluation if the symptoms worsen or if the condition fails to improve as anticipated.    Session Time: 20 minutes  Participation Level: Active  Behavioral Response: CasualAlertEuthymic  Type of Therapy: Individual Therapy  Treatment Goals addressed: client will score less than a 5 on the generalized anxiety disorder 7 scale  ProgressTowards Goals: Progressing  Interventions: CBT and Supportive  Summary:  Candace Hoffman is a 22 y.o. female who presents for the scheduled appointment oriented times five, appropriately dressed, and friendly. Client denied hallucinations and delusions. Client reported on today she is doing well.  Client reported she is related that she passed her courses last semester for school and is moving forward.  Client reported otherwise she has only been taking the bully her medication and not the others that were prescribed to her.  Client reported she is well-managed with that one medication.  Client reported she has been staying engaged socially with her boyfriends events which he puts on for antique cards. Client reported she has also been meeting new people and staying in touch with  her friends. Client reported she still hs anxiety weekly and rates it at a 6 or 7 out of 10. Client reported it is situational stressors and she is able to manage the feeling well. Client reported she has stopped smoking cigarettes and is vaping now.  Evidence of progress towards goal:  client reported medication compliance 7 days per week. Client GAD7 score is a 10.    01/13/2023    8:38 AM 12/02/2022    8:25 AM 07/19/2022    8:34 AM 04/19/2022    8:35 AM  GAD 7 : Generalized Anxiety Score  Nervous, Anxious, on Edge 1 3 2 2   Control/stop worrying 2 2 3 2   Worry too much - different things 2 2 2 3   Trouble relaxing 1 2 2 2   Restless 1 2 2 2   Easily annoyed or irritable 1 2 3 2   Afraid - awful might happen 2 2 1 1   Total GAD 7 Score 10 15 15 14   Anxiety Difficulty Somewhat difficult Somewhat difficult Not difficult at all Not difficult at all     Suicidal/Homicidal: Nowithout intent/plan  Therapist Response:  Therapist began the appointment asking the client how she has been doing since last seen. Therapist used CBT to engage using active listening and positive emotional support. Therapist used CBT to engage and ask the client about her daily activity with her symptoms and positive changes that have occurred. Therapist used CBT to ask the client about medication compliance. Therapist completed SDOH. Therapist used CBT ask the client to identify her progress with frequency of use with coping skills with continued practice in her daily activity.    Therapist assigned the client  homework to practice self care.    Plan: Return again in 3 weeks.  Diagnosis: generalized anxiety disorder  Collaboration of Care: Patient refused AEB none requested by the client.  Patient/Guardian was advised Release of Information must be obtained prior to any record release in order to collaborate their care with an outside provider. Patient/Guardian was advised if they have not already done so to contact  the registration department to sign all necessary forms in order for [place] to release information regarding their care.   Consent: Patient/Guardian gives verbal consent for treatment and assignment of benefits for services provided during this visit. Patient/Guardian expressed understanding and agreed to proceed.   [place], LCSW 01/13/2023

## 2023-01-30 ENCOUNTER — Ambulatory Visit (INDEPENDENT_AMBULATORY_CARE_PROVIDER_SITE_OTHER): Payer: No Payment, Other | Admitting: Student in an Organized Health Care Education/Training Program

## 2023-01-30 ENCOUNTER — Encounter (HOSPITAL_COMMUNITY): Payer: Self-pay | Admitting: Student in an Organized Health Care Education/Training Program

## 2023-01-30 VITALS — BP 119/66 | HR 16 | Resp 16 | Wt 187.0 lb

## 2023-01-30 DIAGNOSIS — F319 Bipolar disorder, unspecified: Secondary | ICD-10-CM | POA: Diagnosis not present

## 2023-01-30 DIAGNOSIS — F411 Generalized anxiety disorder: Secondary | ICD-10-CM

## 2023-01-30 DIAGNOSIS — F1210 Cannabis abuse, uncomplicated: Secondary | ICD-10-CM

## 2023-01-30 DIAGNOSIS — F902 Attention-deficit hyperactivity disorder, combined type: Secondary | ICD-10-CM

## 2023-01-30 MED ORDER — ATOMOXETINE HCL 40 MG PO CAPS: 40.0000 mg | ORAL_CAPSULE | Freq: Every day | ORAL | 2 refills | Status: AC
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
## 2023-02-04 ENCOUNTER — Ambulatory Visit
Admission: EM | Admit: 2023-02-04 | Discharge: 2023-02-04 | Disposition: A | Discharge status: Home / Self Care | Payer: Self-pay | Attending: Internal Medicine | Admitting: Internal Medicine

## 2023-02-04 DIAGNOSIS — J029 Acute pharyngitis, unspecified: Secondary | ICD-10-CM

## 2023-02-04 DIAGNOSIS — H6691 Otitis media, unspecified, right ear: Secondary | ICD-10-CM

## 2023-02-04 LAB — POCT RAPID STREP A (OFFICE): Rapid Strep A Screen: NEGATIVE

## 2023-02-04 MED ORDER — AMOXICILLIN-POT CLAVULANATE 875-125 MG PO TABS: 1.0000 | ORAL_TABLET | Freq: Two times a day (BID) | ORAL | 0 refills | Status: AC

## 2023-02-04 NOTE — ED Triage Notes (Addendum)
Three days ago itchy eye, sore throat, bilateral ear pain with worsening sxs, R ear is very painful feels like fluid and loss of hearing in R ear pain is worse with swallowing and laying down, feeling warm off and on, sweating, nausea, productive cough.   Dayquil, Chloraseptic.
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
## 2023-02-04 NOTE — Discharge Instructions (Addendum)
Your strep test was negative. Recommend salt water gargling and over-the-counter Ibuprofen/Tylenol as needed for pain if you are not allergic.  Return in 2 to 3 days if no improvement. Please go directly to the Emergency Department immediately should you begin to have any of the following symptoms: increased pain, persistent fevers, difficulty swallowing, difficulty talking, drooling or difficulty breathing.  You have been prescribed Augmentin for your ear infection. This is an antibiotic often used to treat ear infections and upper respiratory infections. Please take it as directed.

## 2023-03-10 ENCOUNTER — Ambulatory Visit (HOSPITAL_COMMUNITY): Payer: Medicaid Other | Admitting: Clinical

## 2023-03-10 ENCOUNTER — Encounter (HOSPITAL_COMMUNITY): Payer: Self-pay

## 2023-04-10 ENCOUNTER — Encounter (HOSPITAL_COMMUNITY): Payer: No Payment, Other | Admitting: Student in an Organized Health Care Education/Training Program

## 2024-09-18 ENCOUNTER — Other Ambulatory Visit (HOSPITAL_BASED_OUTPATIENT_CLINIC_OR_DEPARTMENT_OTHER): Payer: Self-pay
